# Patient Record
Sex: Female | Born: 1965 | Race: White | Hispanic: No | Marital: Single | Smoking: Never smoker
Health system: Southern US, Community
[De-identification: ages and names within clinical notes are randomized; demographics above are authoritative.]

## PROBLEM LIST (undated history)

## (undated) DIAGNOSIS — N926 Irregular menstruation, unspecified: Secondary | ICD-10-CM

## (undated) DIAGNOSIS — R896 Abnormal cytological findings in specimens from other organs, systems and tissues: Secondary | ICD-10-CM

## (undated) DIAGNOSIS — R109 Unspecified abdominal pain: Secondary | ICD-10-CM

## (undated) DIAGNOSIS — R51 Headache: Secondary | ICD-10-CM

## (undated) DIAGNOSIS — K219 Gastro-esophageal reflux disease without esophagitis: Secondary | ICD-10-CM

## (undated) DIAGNOSIS — R85618 Other abnormal cytological findings on specimens from anus: Secondary | ICD-10-CM

## (undated) DIAGNOSIS — R3129 Other microscopic hematuria: Secondary | ICD-10-CM

## (undated) DIAGNOSIS — R110 Nausea: Secondary | ICD-10-CM

## (undated) DIAGNOSIS — R1011 Right upper quadrant pain: Secondary | ICD-10-CM

## (undated) DIAGNOSIS — N939 Abnormal uterine and vaginal bleeding, unspecified: Secondary | ICD-10-CM

## (undated) HISTORY — DX: Gastro-esophageal reflux disease without esophagitis: K21.9

## (undated) HISTORY — DX: Unspecified abdominal pain: R10.9

## (undated) HISTORY — DX: Irregular menstruation, unspecified: N92.6

## (undated) HISTORY — DX: Abnormal uterine and vaginal bleeding, unspecified: N93.9

## (undated) HISTORY — DX: Right upper quadrant pain: R10.11

## (undated) HISTORY — DX: Nausea: R11.0

## (undated) HISTORY — DX: Other abnormal cytological findings on specimens from anus: R85.618

## (undated) HISTORY — PX: TONSILLECTOMY: SUR1361

## (undated) HISTORY — DX: Other microscopic hematuria: R31.29

## (undated) HISTORY — DX: Headache: R51

## (undated) HISTORY — DX: Abnormal cytological findings in specimens from other organs, systems and tissues: R89.6

---

## 1998-02-26 ENCOUNTER — Other Ambulatory Visit: Admission: RE | Admit: 1998-02-26 | Discharge: 1998-02-26 | Payer: Self-pay | Admitting: Obstetrics and Gynecology

## 2001-06-30 ENCOUNTER — Other Ambulatory Visit: Admission: RE | Admit: 2001-06-30 | Discharge: 2001-06-30 | Payer: Self-pay | Admitting: Obstetrics and Gynecology

## 2002-01-10 ENCOUNTER — Inpatient Hospital Stay (HOSPITAL_COMMUNITY): Admission: AD | Admit: 2002-01-10 | Discharge: 2002-01-12 | Payer: Self-pay | Admitting: Obstetrics and Gynecology

## 2002-06-30 ENCOUNTER — Other Ambulatory Visit: Admission: RE | Admit: 2002-06-30 | Discharge: 2002-06-30 | Payer: Self-pay | Admitting: Obstetrics and Gynecology

## 2003-05-03 ENCOUNTER — Other Ambulatory Visit: Admission: RE | Admit: 2003-05-03 | Discharge: 2003-05-03 | Payer: Self-pay | Admitting: Obstetrics and Gynecology

## 2004-05-14 ENCOUNTER — Other Ambulatory Visit: Admission: RE | Admit: 2004-05-14 | Discharge: 2004-05-14 | Payer: Self-pay | Admitting: Obstetrics and Gynecology

## 2005-06-10 ENCOUNTER — Other Ambulatory Visit: Admission: RE | Admit: 2005-06-10 | Discharge: 2005-06-10 | Payer: Self-pay | Admitting: Obstetrics and Gynecology

## 2007-09-17 ENCOUNTER — Emergency Department (HOSPITAL_COMMUNITY): Admission: EM | Admit: 2007-09-17 | Discharge: 2007-09-17 | Payer: Self-pay | Admitting: Family Medicine

## 2008-07-26 ENCOUNTER — Emergency Department (HOSPITAL_COMMUNITY): Admission: EM | Admit: 2008-07-26 | Discharge: 2008-07-26 | Payer: Self-pay | Admitting: Emergency Medicine

## 2009-02-08 ENCOUNTER — Encounter: Admission: RE | Admit: 2009-02-08 | Discharge: 2009-02-08 | Payer: Self-pay | Admitting: Obstetrics and Gynecology

## 2010-02-21 ENCOUNTER — Ambulatory Visit: Payer: Self-pay | Admitting: Internal Medicine

## 2010-02-21 DIAGNOSIS — R109 Unspecified abdominal pain: Secondary | ICD-10-CM | POA: Insufficient documentation

## 2010-02-21 DIAGNOSIS — R896 Abnormal cytological findings in specimens from other organs, systems and tissues: Secondary | ICD-10-CM

## 2010-02-21 DIAGNOSIS — N939 Abnormal uterine and vaginal bleeding, unspecified: Secondary | ICD-10-CM

## 2010-02-21 DIAGNOSIS — N926 Irregular menstruation, unspecified: Secondary | ICD-10-CM | POA: Insufficient documentation

## 2010-02-21 LAB — CONVERTED CEMR LAB
ALT: 13 units/L (ref 0–35)
Albumin: 4 g/dL (ref 3.5–5.2)
Amylase: 50 units/L (ref 27–131)
BUN: 12 mg/dL (ref 6–23)
Basophils Relative: 0.1 % (ref 0.0–3.0)
CO2: 27 meq/L (ref 19–32)
Chloride: 106 meq/L (ref 96–112)
Creatinine, Ser: 0.7 mg/dL (ref 0.4–1.2)
Eosinophils Absolute: 0 10*3/uL (ref 0.0–0.7)
Eosinophils Relative: 0.5 % (ref 0.0–5.0)
Glucose, Bld: 82 mg/dL (ref 70–99)
Lipase: 32 units/L (ref 11.0–59.0)
Lymphocytes Relative: 14.2 % (ref 12.0–46.0)
Neutrophils Relative %: 79.2 % — ABNORMAL HIGH (ref 43.0–77.0)
Nitrite: NEGATIVE
Platelets: 245 10*3/uL (ref 150.0–400.0)
RBC: 4.17 M/uL (ref 3.87–5.11)
Total Protein: 6.7 g/dL (ref 6.0–8.3)
Urobilinogen, UA: 0.2
WBC: 8.4 10*3/uL (ref 4.5–10.5)

## 2010-02-27 ENCOUNTER — Telehealth: Payer: Self-pay | Admitting: Internal Medicine

## 2010-03-07 ENCOUNTER — Ambulatory Visit: Payer: Self-pay | Admitting: Internal Medicine

## 2010-03-07 DIAGNOSIS — R3129 Other microscopic hematuria: Secondary | ICD-10-CM

## 2010-03-14 ENCOUNTER — Telehealth: Payer: Self-pay

## 2010-03-14 ENCOUNTER — Ambulatory Visit
Admission: RE | Admit: 2010-03-14 | Discharge: 2010-03-14 | Payer: Self-pay | Source: Home / Self Care | Attending: Internal Medicine | Admitting: Internal Medicine

## 2010-03-14 ENCOUNTER — Encounter: Payer: Self-pay | Admitting: Internal Medicine

## 2010-03-14 LAB — CONVERTED CEMR LAB
Bacteria, UA: NONE SEEN
Blood in Urine, dipstick: NEGATIVE
Casts: NONE SEEN /lpf
Glucose, Urine, Semiquant: NEGATIVE
Ketones, urine, test strip: NEGATIVE
Specific Gravity, Urine: 1.01
WBC Urine, dipstick: NEGATIVE
pH: 7

## 2010-03-24 ENCOUNTER — Telehealth: Payer: Self-pay | Admitting: Internal Medicine

## 2010-03-25 ENCOUNTER — Encounter
Admission: RE | Admit: 2010-03-25 | Discharge: 2010-03-25 | Payer: Self-pay | Source: Home / Self Care | Attending: Obstetrics and Gynecology | Admitting: Obstetrics and Gynecology

## 2010-04-10 NOTE — Progress Notes (Signed)
----   Converted from flag ---- ---- 03/14/2010 4:58 PM, Edwyna Perfect MD wrote: pls notify no further blood in urine ------------------------------  Phone Note Outgoing Call   Call placed by: Kyung Rudd, CMA,  March 14, 2010 5:07 PM Call placed to: Patient Details for Reason: results Summary of Call: pt aware Initial call taken by: Kyung Rudd, CMA,  March 14, 2010 5:07 PM

## 2010-04-10 NOTE — Progress Notes (Signed)
Summary: lab results  ---- Converted from flag ---- ---- 02/24/2010 4:20 PM, Edwyna Perfect MD wrote: labs nl but missing ua and urine hcg ------------------------------  pt aware

## 2010-04-10 NOTE — Assessment & Plan Note (Signed)
Summary: TO BE EST/REQ CPX/PT WILL COME IN FASTING/NJR   Vital Signs:  Patient profile:   45 year old female Height:      63.25 inches Weight:      121 pounds BMI:     21.34 O2 Sat:      96 % on Room air Temp:     98.3 degrees F oral Pulse rate:   82 / minute BP sitting:   120 / 80  (left arm) Cuff size:   regular  Vitals Entered By: Romualdo Bolk, CMA (AAMA) (February 21, 2010 8:22 AM)  O2 Flow:  Room air CC: New Pt to get establish- Pt is fasting for labs   CC:  New Pt to get establish- Pt is fasting for labs.  History of Present Illness: Pt presents to clinic for establishment of primary medical care and evaluation of abdominal discomfort.  Notes recent onset of intermittent mild epigastric/periumbilical abd pain. +mild associated nausea without emesis. No radiation of pain. No alleviating or exacerbating factors.  Did have previous episode of brief right flank pain that is sometimes reproducible with palpation. Denies urinary symptoms such as frequency, burning or hematuria. No known h/o kidney stones.  Also c/o recent episode of pelvic pain with now irregular menstration. Currently being followed by gyn for abn pap with pending appt next week.    Preventive Screening-Counseling & Management  Alcohol-Tobacco     Smoking Status: never     Smoking Cessation Counseling: no     Tobacco Counseling: not indicated; no tobacco use      Drug Use:  no.    Current Medications (verified): 1)  Ortho Tri-Cyclen (28) 0.18/0.215/0.25 Mg-35 Mcg Tabs (Norgestim-Eth Estrad Triphasic) .Marland Kitchen.. 1 By Mouth Once Daily 2)  Herbs  Allergies (verified): No Known Drug Allergies  Social History: Married Alcohol use-no Drug use-no Smoking Status:  never Drug Use:  no  Review of Systems      See HPI  Physical Exam  General:  Well-developed,well-nourished,in no acute distress; alert,appropriate and cooperative throughout examination Head:  Normocephalic and atraumatic without obvious  abnormalities. No apparent alopecia or balding. Eyes:  pupils equal, pupils round, pupils reactive to light, corneas and lenses clear, and no injection.   Ears:  no external deformities.   Nose:  no external deformity.   Mouth:  Oral mucosa and oropharynx without lesions or exudates.  Teeth in good repair. Neck:  No deformities, masses, or tenderness noted. Lungs:  Normal respiratory effort, chest expands symmetrically. Lungs are clear to auscultation, no crackles or wheezes. Heart:  Normal rate and regular rhythm. S1 and S2 normal without gallop, murmur, click, rub or other extra sounds. Abdomen:  soft, normal bowel sounds, no distention, no masses, no guarding, no rigidity, no rebound tenderness, no hepatomegaly, and no splenomegaly.  +mild tenderness epigastric area and periumbilical   Impression & Recommendations:  Problem # 1:  ABDOMINAL PAIN OTHER SPECIFIED SITE (ICD-789.09) Assessment New Suspect gastric etiology. Begin empiric PPI tx. Obtain labs. Schedule close followup for re-evaluation. Consider GI if symptoms refractory. Orders: TLB-BMP (Basic Metabolic Panel-BMET) (80048-METABOL) TLB-Hepatic/Liver Function Pnl (80076-HEPATIC) TLB-CBC Platelet - w/Differential (85025-CBCD) TLB-Amylase (82150-AMYL) TLB-Lipase (83690-LIPASE) TLB-Udip w/ Micro (81001-URINE) Specimen Handling (81191) Venipuncture (47829)  Problem # 2:  UNS D/O MENSTRUATION&OTH ABN BLEED FE GNT TRACT (ICD-626.9) Assessment: New Obtain urine hcg and CBC. Keep f/u appt with gyn next week to further address symptoms.  Complete Medication List: 1)  Ortho Tri-cyclen (28) 0.18/0.215/0.25 Mg-35 Mcg Tabs (Norgestim-eth estrad triphasic) .Marland KitchenMarland KitchenMarland Kitchen  1 by mouth once daily 2)  Herbs  3)  Omeprazole 40 Mg Cpdr (Omeprazole) .... One by mouth qd  Other Orders: Urine Pregnancy Test  ([address])  Patient Instructions: 1)  Please schedule a follow-up appointment in 2-3 weeks. Prescriptions: OMEPRAZOLE 40 MG CPDR (OMEPRAZOLE) one  by mouth qd  #30 x 6   Entered and Authorized by:   Edwyna Perfect MD   Signed by:   Edwyna Perfect MD on 02/21/2010   Method used:   Electronically to        [facility]  [phone]* (retail)       [address]       [place], [place]  [address]       Ph: [phone] or [phone]       Fax: [phone]   RxID:   [id number]    Orders Added: 1)  TLB-BMP (Basic Metabolic Panel-BMET) [80048-METABOL] 2)  TLB-Hepatic/Liver Function Pnl [80076-HEPATIC] 3)  TLB-CBC Platelet - w/Differential [85025-CBCD] 4)  TLB-Amylase [82150-AMYL] 5)  TLB-Lipase [83690-LIPASE] 6)  TLB-Udip w/ Micro [81001-URINE] 7)  Urine Pregnancy Test  [81025] 8)  Specimen Handling [99000] 9)  Venipuncture [03474] 10)  New Patient Level II [99202]    Laboratory Results   Urine Tests    Routine Urinalysis   Color: yellow Appearance: Clear Glucose: negative   (Normal Range: Negative) Bilirubin: negative   (Normal Range: Negative) Ketone: 2+   (Normal Range: Negative) Spec. Gravity: 1.020   (Normal Range: 1.003-1.035) Blood: 1+   (Normal Range: Negative) Protein: 1+   (Normal Range: Negative) Urobilinogen: 0.2   (Normal Range: 0-1) Nitrite: negative   (Normal Range: Negative) Leukocyte Esterace: negative   (Normal Range: Negative)    Urine HCG: negative Comments: Rita Ohara  February 21, 2010 3:11 PM

## 2010-04-10 NOTE — Assessment & Plan Note (Signed)
Summary: 2-3 WK ROV//SLM   Vital Signs:  Patient profile:   45 year old female Weight:      125 pounds BP sitting:   110 / 80  (left arm)  Vitals Entered By: Kyung Rudd, CMA (March 07, 2010 8:58 AM)  History of Present Illness: Patient presents to clinic for followup of multiple medical problems. Recently evaluated for abd pain and nausea.Notes discomfort typically epigastric area. Began daily PPI and may note improvement over last several days. Labs reviewed nl with exception of blood on UA. No gross hematuria. Previous single episode of flank pain with no further episodes. Wt reviewed stable. States is being evaluated by gyn for pelvic pain.  Current Medications (verified): 1)  Ortho Tri-Cyclen (28) 0.18/0.215/0.25 Mg-35 Mcg Tabs (Norgestim-Eth Estrad Triphasic) .Marland Kitchen.. 1 By Mouth Once Daily 2)  Herbs 3)  Omeprazole 40 Mg Cpdr (Omeprazole) .... One By Mouth Qd  Allergies (verified): No Known Drug Allergies  Social History: Reviewed history from 02/21/2010 and no changes required. Married Alcohol use-no Drug use-no  Review of Systems GI:  Complains of abdominal pain; denies bloody stools, dark tarry stools, loss of appetite, nausea, and vomiting. GU:  Denies hematuria.  Physical Exam  General:  Well-developed,well-nourished,in no acute distress; alert,appropriate and cooperative throughout examination   Impression & Recommendations:  Problem # 1:  ABDOMINAL PAIN OTHER SPECIFIED SITE (ICD-789.09) Assessment Improved Recent improvement with PPI tx. Continue PPI. If no resolution of symptoms within 3-4 wks then proceed with GI consult  Problem # 2:  MICROSCOPIC HEMATURIA (ICD-599.72) Assessment: New Repeat UA  Complete Medication List: 1)  Ortho Tri-cyclen (28) 0.18/0.215/0.25 Mg-35 Mcg Tabs (Norgestim-eth estrad triphasic) .Marland Kitchen.. 1 by mouth once daily 2)  Herbs  3)  Omeprazole 40 Mg Cpdr (Omeprazole) .... One by mouth qd  Patient Instructions: 1)  Please  schedule a follow-up appointment in 3 months. 2)  Please obtain urinalysis with micro in one week dx:599.72

## 2010-04-10 NOTE — Progress Notes (Signed)
  Phone Note Outgoing Call   Call placed by: Kyung Rudd, CMA,  March 24, 2010 1:38 PM Call placed to: Patient Summary of Call: discussed with Dr. Rodena Medin about seeing a GI if sx didn't improve with meds. Pt notes that she is not feeling any better and would like referral. Initial call taken by: Kyung Rudd, CMA,  March 24, 2010 1:40 PM  Follow-up for Phone Call        referral placed Follow-up by: Edwyna Perfect MD,  March 24, 2010 1:47 PM  Additional Follow-up for Phone Call Additional follow up Details #1::        pt aware Additional Follow-up by: Kyung Rudd, CMA,  March 24, 2010 2:11 PM

## 2010-04-14 ENCOUNTER — Encounter: Payer: Self-pay | Admitting: Internal Medicine

## 2010-04-25 ENCOUNTER — Other Ambulatory Visit: Payer: Self-pay | Admitting: Gastroenterology

## 2010-04-25 ENCOUNTER — Encounter: Payer: Self-pay | Admitting: Gastroenterology

## 2010-04-25 ENCOUNTER — Telehealth: Payer: Self-pay | Admitting: Gastroenterology

## 2010-04-25 ENCOUNTER — Encounter (INDEPENDENT_AMBULATORY_CARE_PROVIDER_SITE_OTHER): Payer: Self-pay | Admitting: *Deleted

## 2010-04-25 ENCOUNTER — Ambulatory Visit (INDEPENDENT_AMBULATORY_CARE_PROVIDER_SITE_OTHER): Payer: Self-pay | Admitting: Gastroenterology

## 2010-04-25 DIAGNOSIS — R1011 Right upper quadrant pain: Secondary | ICD-10-CM

## 2010-04-25 DIAGNOSIS — R110 Nausea: Secondary | ICD-10-CM | POA: Insufficient documentation

## 2010-04-25 DIAGNOSIS — K219 Gastro-esophageal reflux disease without esophagitis: Secondary | ICD-10-CM | POA: Insufficient documentation

## 2010-04-25 DIAGNOSIS — K5900 Constipation, unspecified: Secondary | ICD-10-CM

## 2010-04-25 DIAGNOSIS — R51 Headache: Secondary | ICD-10-CM | POA: Insufficient documentation

## 2010-04-25 DIAGNOSIS — R519 Headache, unspecified: Secondary | ICD-10-CM | POA: Insufficient documentation

## 2010-04-25 LAB — IGA: IgA: 213 mg/dL (ref 68–378)

## 2010-04-25 LAB — CONVERTED CEMR LAB: Tissue Transglutaminase Ab, IgA: 2.5 units (ref <20)

## 2010-04-25 LAB — HIGH SENSITIVITY CRP: CRP, High Sensitivity: 2.24 mg/L (ref 0.00–5.00)

## 2010-04-30 NOTE — Progress Notes (Signed)
Summary: Medication  Phone Note Call from Patient   Caller: Patient Call For: Dr. Jarold Motto Reason for Call: Talk to Nurse Summary of Call: Pt. said Dr. Jarold Motto was going to prescribe something for acid reflux this morning. But the pharmacy doesn't have it. Initial call taken by: Karna Christmas,  April 25, 2010 12:49 PM  Follow-up for Phone Call        Dr. Jarold Motto wanted to order Librax , rx sent pt aware Follow-up by: Harlow Mares CMA (AAMA),  April 25, 2010 1:20 PM    New/Updated Medications: LIBRAX 2.5-5 MG CAPS (CLIDINIUM-CHLORDIAZEPOXIDE) take one by mouth three times a day before meals. Prescriptions: LIBRAX 2.5-5 MG CAPS (CLIDINIUM-CHLORDIAZEPOXIDE) take one by mouth three times a day before meals.  #90 x 3   Entered by:   Harlow Mares CMA (AAMA)   Authorized by:   Mardella Layman MD [facility]   Signed by:   Harlow Mares CMA (AAMA) on 04/25/2010   Method used:   Electronically to        [facility]  [phone]* (retail)       [address]       [place], [place]  [address]       Ph: [phone] or [phone]       Fax: [phone]   RxID:   [id number]

## 2010-04-30 NOTE — Assessment & Plan Note (Addendum)
Summary: PERSISTANT ABD PAIN/YF   Lisa Orr/NO GI HX//NO INS. ADVISED TO B...   History of Present Illness Visit Type: Initial Consult Primary GI MD: Sheryn Bison MD FACP FAGA Primary Provider: Charlynn Court, MD  Requesting Provider: Charlynn Court, MD  Chief Complaint: Pt c/o RUQ abd pain, headaches and nausea  History of Present Illness:   Very Pleasant 45 year old Caucasian female referred by Dr. Charlynn Court for evaluation of 4-5 months of diffuse abdominal discomfort, burning substernal chest pain, and refractory constipation.  Deionna had been married approximately one year, and feels that she is" falling apart". She's had lifelong constipation and takes 6 magnesium citrate tablets a day .Over the last 4-5 months she's had pain in her right upper quadrant,epigastric area, periumbilical area, and also burning pain in her chest despite being on omeprazole 40 mg a day for several months. She is dry variety of her herbal substances without improvement. She is on daily birth control pills and denies gynecologic problems. There is no history of alcohol, cigarette, or NSAID abuse.  She denies typical reflux symptoms but does have burning pain mostly in her upper chest area. There no specific cardiopulmonary symptoms, and she denies any hepatobiliary complaints or history of hepatitis or pancreatitis. Her appetite is good her weight is stable. She denies any specific food intolerances, or systemic complaints such as fever or chills, skin rashes or joint pains. She has not had previous GI evaluations. Lab review shows normal CBC and metabolic profile, amylase and lipase.  She does admit to personal stress which may be exacerbating her symptoms She denies dysphagia, clay colored stools, dark urine, icterus, fever or chills.   GI Review of Systems    Reports abdominal pain, acid reflux, bloating, chest pain, heartburn, and  nausea.     Location of  Abdominal pain: RUQ.    Denies belching, dysphagia  with liquids, dysphagia with solids, loss of appetite, vomiting, vomiting blood, weight loss, and  weight gain.      Reports constipation and  rectal bleeding.     Denies anal fissure, black tarry stools, change in bowel habit, diarrhea, diverticulosis, fecal incontinence, heme positive stool, hemorrhoids, irritable bowel syndrome, jaundice, light color stool, liver problems, and  rectal pain.    Current Medications (verified): 1)  Ortho Tri-Cyclen (28) 0.18/0.215/0.25 Mg-35 Mcg Tabs (Norgestim-Eth Estrad Triphasic) .Marland Kitchen.. 1 By Mouth Once Daily 2)  Herbs 3)  Omeprazole 40 Mg Cpdr (Omeprazole) .... One By Mouth Qd 4)  Cal/mag Citrate 250-125 Mg Tabs (Calcium-Magnesium) .... Six Tablets By Mouth Once Daily  Allergies (verified): No Known Drug Allergies  Past History:  Past medical, surgical, family and social histories (including risk factors) reviewed for relevance to current acute and chronic problems.  Past Medical History: RUQ PAIN (ICD-789.01) GERD (ICD-530.81) NAUSEA (ICD-787.02) HEADACHE (ICD-784.0) MICROSCOPIC HEMATURIA (ICD-599.72) UNS D/O MENSTRUATION&OTH ABN BLEED FE GNT TRACT (ICD-626.9) OTHER ABNORMAL PAP SMEAR OF ANUS AND ANAL HPV (ICD-796.79) ABDOMINAL PAIN OTHER SPECIFIED SITE (ICD-789.09)  Past Surgical History: Tonsillectomy  Family History: Reviewed history and no changes required. 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  The patient denies anemia, arthritis/joint pain,  back pain, breast changes/lumps, change in vision, confusion, cough, coughing up blood,  depression-new, fainting, fatigue, fever, hearing problems, heart murmur, heart rhythm changes, itching, menstrual pain, muscle pains/cramps, night sweats, nosebleeds, pregnancy symptoms, shortness of breath, skin rash, sleeping problems, sore throat, swelling of feet/legs, swollen lymph glands, thirst - excessive , urination - excessive , urination changes/pain, urine leakage, vision changes, and voice change.    Vital Signs:  Patient profile:   45 year old female Height:      63.25 inches Weight:      125 pounds BMI:     22.05 BSA:     1.59 Pulse rate:   88 / minute Pulse rhythm:   regular BP sitting:   110 / 76  Vitals Entered By: Ok Anis CMA (April 25, 2010 10:11 AM)  Physical Exam  General:  Well developed, well nourished, no acute distress.healthy appearing.   Head:  Normocephalic and atraumatic. Eyes:  PERRLA, no icterus.exam deferred to patient's ophthalmologist.   Neck:  Supple; no masses or thyromegaly. Lungs:  Clear throughout to auscultation. Heart:  Regular rate and rhythm; no murmurs, rubs,  or bruits. Abdomen:  Soft, nontender and nondistended. No masses, hepatosplenomegaly or hernias noted. Normal bowel sounds.There is some hepatic prominence on exam somewhat firm liver edge. She also has tenderness over a floating rib in the right upper quadrant area. Rectal:  deferred until time of colonoscopy.   Msk:  Symmetrical with no gross deformities. Normal posture. Pulses:  Normal pulses noted. Extremities:  No clubbing, cyanosis, edema or deformities noted. Neurologic:  Alert and  oriented x4;  grossly normal neurologically. Skin:  Intact without significant lesions or rashes. Cervical Nodes:  No significant cervical adenopathy. Psych:  Alert and cooperative. Normal mood and affect.   Impression & Recommendations:  Problem # 1:  CONSTIPATION (ICD-564.00) Assessment Unchanged Colonoscopy scheduled her convenience. She has a family history of ulcerative colitis in  her mother. Screening labs also ordered. 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 Orders: TLB-CRP-High Sensitivity (C-Reactive Protein) (86140-FCRP) TLB-Sedimentation Rate (ESR) (85652-ESR) TLB-IgA (Immunoglobulin A) (82784-IGA) T-Sprue Panel (Celiac Disease Aby Eval) (83516x3/[address]) Colon/Endo (Colon/Endo)  Problem # 2:  RUQ PAIN (ICD-789.01) Assessment: Unchanged Probable musculoskeletal pain, rule out cholelithiasis,structural hepatic lesions, or occult liver disease.She does admit to stress plan a role in her symptomatology, and I prescribed Librax t.i.d. Orders: TLB-CRP-High Sensitivity (C-Reactive Protein) (86140-FCRP) TLB-Sedimentation Rate (ESR) (85652-ESR) TLB-IgA (Immunoglobulin A) (82784-IGA) T-Sprue Panel (Celiac Disease Aby Eval) (83516x3/[address]) Colon/Endo (Colon/Endo) Ultrasound Abdomen (UAS)  Problem # 3:  GERD (ICD-530.81) Assessment: Unchanged Unusual burning chest pain unresponsive to omeprazole therapy. Endoscopy has been scheduled and we will check her for H. pylori, celiac disease, and eosinophilic esophagitis. I asked to continue her omeprazole and I placed her on Librax one p.o. t.i.d. before meals pending further workup. Orders: TLB-CRP-High Sensitivity (C-Reactive Protein) (86140-FCRP) TLB-Sedimentation Rate (ESR) (85652-ESR) TLB-IgA (Immunoglobulin A) (82784-IGA) T-Sprue Panel (Celiac Disease Aby Eval) (83516x3/[address]) Colon/Endo (Colon/Endo)   Patient Instructions: 1)  Copy sent to : Charlynn Court, MD  2)  Your procedure has been scheduled for 05/05/2010, please follow the seperate instructions.  3)  [facility] Patient Information Guide given to patient.  4)  Colonoscopy and Flexible Sigmoidoscopy brochure given.  5)  Upper Endoscopy brochure given.  6)  Your prescription(s) have been sent to you pharmacy.  7)  Please go to the basement today for your labs.  8)  Your abdomainal ultrasound is scheduled for 05/01/2010,  please follow seperate instructions.  9)  The medication list was reviewed and reconciled.  All changed / newly prescribed medications were explained.  A complete medication list was provided to the patient / caregiver. Prescriptions: MOVIPREP 100 GM  SOLR (PEG-KCL-NACL-NASULF-NA ASC-C) As per prep instructions.  #1 x 0   Entered by:   Harlow Mares CMA (AAMA)   Authorized by:   Mardella Layman MD [facility]   Signed by:   Harlow Mares CMA (AAMA) on 04/25/2010   Method used:   Electronically to        [facility]  [phone]* (retail)       [address]       [place], [place]  [address]       Ph: [phone] or [phone]       Fax: [phone]   RxID:   [id number]

## 2010-04-30 NOTE — Letter (Signed)
Summary: [facility] Instructions  [facility]  [address] [place], [place] [address]   Phone: [phone]  Fax: [phone]       Lisa Orr    June 11, 1965    MRN: [id number]        Procedure Day /Date: Monday 05/05/2010     Arrival Time: 1:00pm     Procedure Time: 2:00pm     Location of Procedure:                    [facility] (4th Floor)  PREPARATION FOR COLONOSCOPY WITH MOVIPREP   Starting 5 days prior to your procedure 2/22/2012at nuts, seeds, popcorn, corn, beans, peas,  salads, or any raw vegetables.  Do not take any fiber supplements (e.g. Metamucil, Citrucel, and Benefiber).  THE DAY BEFORE YOUR PROCEDURE         Sunday 05/04/2010  1.  Drink clear liquids the entire day-NO SOLID FOOD  2.  Do not drink anything colored red or purple.  Avoid juices with pulp.  No orange juice.  3.  Drink at least 64 oz. (8 glasses) of fluid/clear liquids during the day to prevent dehydration and help the prep work efficiently.  CLEAR LIQUIDS INCLUDE: Water Jello Ice Popsicles Tea (sugar ok, no milk/cream) Powdered fruit flavored drinks Coffee (sugar ok, no milk/cream) Gatorade Juice: apple, white grape, white cranberry  Lemonade Clear bullion, consomm, broth Carbonated beverages (any kind) Strained chicken noodle soup Hard Candy                             4.  In the morning, mix first dose of MoviPrep solution:    Empty 1 Pouch A and 1 Pouch B into the disposable container    Add lukewarm drinking water to the top line of the container. Mix to dissolve    Refrigerate (mixed solution should be used within 24 hrs)  5.  Begin drinking the prep at 5:00 p.m. The MoviPrep container is divided by 4 marks.   Every 15 minutes drink the solution down to the next mark (approximately 8 oz) until the full liter is complete.   6.  Follow completed prep with 16 oz of clear liquid of your choice (Nothing red or purple).  Continue to drink clear  liquids until bedtime.  7.  Before going to bed, mix second dose of MoviPrep solution:    Empty 1 Pouch A and 1 Pouch B into the disposable container    Add lukewarm drinking water to the top line of the container. Mix to dissolve    Refrigerate  THE DAY OF YOUR PROCEDURE      Monday 05/05/2010  Beginning at 9:00am (hours before procedure):         1. Every 15 minutes, drink the solution down to the next mark (approx 8 oz) until the full liter is complete.  2. Follow completed prep with 16 oz. of clear liquid of your choice.    3. You may drink clear liquids until 12:00pm(2 HOURS BEFORE PROCEDURE).   MEDICATION INSTRUCTIONS  Unless otherwise instructed, you should take regular prescription medications with a small sip of water   as early as possible the morning of your procedure.        OTHER INSTRUCTIONS  You will need a responsible adult at least 44 years of age to accompany you and drive you home.   This person must remain in  the waiting room during your procedure.  Wear loose fitting clothing that is easily removed.  Leave jewelry and other valuables at home.  However, you may wish to bring a book to read or  an iPod/MP3 player to listen to music as you wait for your procedure to start.  Remove all body piercing jewelry and leave at home.  Total time from sign-in until discharge is approximately 2-3 hours.  You should go home directly after your procedure and rest.  You can resume normal activities the  day after your procedure.  The day of your procedure you should not:   Drive   Make legal decisions   Operate machinery   Drink alcohol   Return to work  You will receive specific instructions about eating, activities and medications before you leave.    The above instructions have been reviewed and explained to me by   _______________________    I fully understand and can verbalize these instructions _____________________________ Date _________

## 2010-05-01 ENCOUNTER — Encounter (INDEPENDENT_AMBULATORY_CARE_PROVIDER_SITE_OTHER): Payer: Self-pay | Admitting: *Deleted

## 2010-05-01 ENCOUNTER — Ambulatory Visit (HOSPITAL_COMMUNITY)
Admission: RE | Admit: 2010-05-01 | Discharge: 2010-05-01 | Disposition: A | Discharge status: Home / Self Care | Payer: Self-pay | Source: Ambulatory Visit | Attending: Gastroenterology | Admitting: Gastroenterology

## 2010-05-01 ENCOUNTER — Encounter: Payer: Self-pay | Admitting: Gastroenterology

## 2010-05-01 DIAGNOSIS — R1011 Right upper quadrant pain: Secondary | ICD-10-CM | POA: Insufficient documentation

## 2010-05-02 ENCOUNTER — Telehealth: Payer: Self-pay | Admitting: Gastroenterology

## 2010-05-05 ENCOUNTER — Encounter (AMBULATORY_SURGERY_CENTER): Payer: Self-pay | Admitting: Gastroenterology

## 2010-05-05 ENCOUNTER — Other Ambulatory Visit: Payer: Self-pay | Admitting: Gastroenterology

## 2010-05-05 ENCOUNTER — Encounter: Payer: Self-pay | Admitting: Gastroenterology

## 2010-05-05 DIAGNOSIS — R109 Unspecified abdominal pain: Secondary | ICD-10-CM

## 2010-05-05 DIAGNOSIS — K5900 Constipation, unspecified: Secondary | ICD-10-CM

## 2010-05-05 DIAGNOSIS — R1011 Right upper quadrant pain: Secondary | ICD-10-CM

## 2010-05-06 LAB — HELICOBACTER PYLORI SCREEN-BIOPSY: UREASE: NEGATIVE

## 2010-05-06 NOTE — Progress Notes (Signed)
Summary: Ultrasound results  Phone Note Call from Patient Call back at Home Phone [phone]   Caller: Patient Call For: Dr. Jarold Motto Reason for Call: Lab or Test Results Summary of Call: Calling about Abd Ultrasound results Initial call taken by: Karna Christmas,  May 02, 2010 1:54 PM  Follow-up for Phone Call        abdominal ultrasound is normal. she is given the number to pt assistance [phone].  Follow-up by: Harlow Mares CMA Duncan Dull),  May 02, 2010 3:56 PM

## 2010-05-13 ENCOUNTER — Encounter: Payer: Self-pay | Admitting: Gastroenterology

## 2010-05-15 NOTE — Miscellaneous (Signed)
Summary: Abdominal U/S  US Abdomen Complete - STATUS: Final  IMAGE                                     Perform Date: 23Feb12 09:48  Ordered By: Talbert Forest Date: [id number] 08:48  Facility: [facility]                              Department: [place]  Service Report Text  [facility] Accession Number: [id number]     Clinical Data:  45 year old female with abdominal pain.    COMPLETE ABDOMINAL ULTRASOUND    Comparison:  None.    Findings:    Gallbladder:  No gallstones, gallbladder wall thickening, or   pericholecystic fluid.  No sonographic Murphy's sign elicited.    Common bile duct:  Normal measuring 1-2 mm in diameter.    Liver:  No focal lesion identified.  Within normal limits in   parenchymal echogenicity.    IVC:  Appears normal.    Pancreas:  No focal abnormality seen.    Spleen:  Normal measuring 4.9 cm in length.    Right Kidney:  Normal measuring 10.9 cm in length.    Left Kidney:  Normal measuring 10.4 cm in length.    Abdominal aorta:  No aneurysm identified.    IMPRESSION:   Negative abdominal ultrasound.    Original Report Authenticated By: Harley Hallmark, M.D.  Additional Information  HL7 RESULT STATUS : F  External image : [id number]  External IF Update Timestamp : 2010-05-01:09:48:00.000000 Clinical Lists Changes

## 2010-05-15 NOTE — Miscellaneous (Signed)
Summary: clotest  Clinical Lists Changes  Orders: Added new Test order of TLB-H Pylori Screen Gastric Biopsy (83013-CLOTEST) - Signed 

## 2010-05-15 NOTE — Procedures (Signed)
Summary: Colonoscopy  Patient: Romana Deaton Note: All result statuses are Final unless otherwise noted.  Tests: (1) Colonoscopy (COL)   COL Colonoscopy           DONE     [facility]     [address]     [place], [place]  [address]           COLONOSCOPY PROCEDURE REPORT           PATIENT:  Rielly, Brunn  MR#:  [id number]     BIRTHDATE:  Apr 21, 1965, 44 yrs. old  GENDER:  female     ENDOSCOPIST:  Vania Rea. Jarold Motto, MD, [facility]     REF. BY:  Charlynn Court, M.D.     PROCEDURE DATE:  05/05/2010     PROCEDURE:  Average-risk screening colonoscopy     G0121     ASA CLASS:  Class I     INDICATIONS:  constipation, Abdominal pain     MEDICATIONS:   Fentanyl 75 mcg IV, Versed 8 mg IV           DESCRIPTION OF PROCEDURE:   After the risks benefits and     alternatives of the procedure were thoroughly explained, informed     consent was obtained.  Digital rectal exam was performed and     revealed no abnormalities.   The [id number] endoscope was     introduced through the anus and advanced to the cecum, which was     identified by both the appendix and ileocecal valve, without     limitations.  The quality of the prep was excellent, using     MoviPrep.  The instrument was then slowly withdrawn as the colon     was fully examined.     <<PROCEDUREIMAGES>>           FINDINGS:  No polyps or cancers were seen.  This was otherwise a     normal examination of the colon.   Retroflexed views in the rectum     revealed no abnormalities.    The scope was then withdrawn from     the patient and the procedure completed.           COMPLICATIONS:  None     ENDOSCOPIC IMPRESSION:     1) No polyps or cancers     2) Otherwise normal examination     CONSTIPATION PREDOMINANT IBS.     RECOMMENDATIONS:     1) high fiber diet     2) metamucil or benefiber     3) Upper endoscopy will be scheduled     REPEAT EXAM:  No           ______________________________     Vania Rea. Jarold Motto, MD,  Clementeen Graham           CC:           n.     eSIGNED:   Vania Rea. Mikias Lanz at 05/05/2010 03:40 PM           Gordy Councilman, [id number]  Note: An exclamation mark (!) indicates a result that was not dispersed into the flowsheet. Document Creation Date: 05/05/2010 3:41 PM _______________________________________________________________________  (1) Order result status: Final Collection or observation date-time: 05/05/2010 15:29 Requested date-time:  Receipt date-time:  Reported date-time:  Referring Physician:   Ordering Physician: Sheryn Bison [id number]) Specimen Source:  Source: Launa Grill Order Number: [id number] Lab site:

## 2010-05-15 NOTE — Procedures (Addendum)
Summary: Upper Endoscopy  Patient: Lisa Orr Note: All result statuses are Final unless otherwise noted.  Tests: (1) Upper Endoscopy (EGD)   EGD Upper Endoscopy       DONE     [facility]     [address]     [place], [place]  [address]          ENDOSCOPY PROCEDURE REPORT          PATIENT:  [name], [name]  MR#:  [id number]     BIRTHDATE:  1965-03-13, 44 yrs. old  GENDER:  female          ENDOSCOPIST:  Vania Rea. Jarold Motto, MD, [facility]     Referred by:  Rollene Rotunda, M.D.          PROCEDURE DATE:  05/05/2010     PROCEDURE:  EGD with biopsy, 43239, EGD with biopsy for H. pylori     43239     ASA CLASS:  Class I     INDICATIONS:  RUQ PAIN          MEDICATIONS:   There was residual sedation effect present from     prior procedure., Fentanyl 25 mcg IV, Versed 2 mg IV     TOPICAL ANESTHETIC:  Exactacain Spray          DESCRIPTION OF PROCEDURE:   After the risks benefits and     alternatives of the procedure were thoroughly explained, informed     consent was obtained.  The [facility] GIF-H180 [id number] endoscope was     introduced through the mouth and advanced to the second portion of     the duodenum, without limitations.  The instrument was slowly     withdrawn as the mucosa was fully examined.     <<PROCEDUREIMAGES>>          The upper, middle, and distal third of the esophagus were     carefully inspected and no abnormalities were noted. The z-line     was well seen at the GEJ. The endoscope was pushed into the fundus     which was normal including a retroflexed view. The antrum,gastric     body, first and second part of the duodenum were unremarkable. SI     BIOPSY AND CLO BX. DONE.    Retroflexed views revealed no     abnormalities.    The scope was then withdrawn from the patient     and the procedure completed.          COMPLICATIONS:  None          ENDOSCOPIC IMPRESSION:     1) Normal EGD     R/O CELIAC DISEASE,H.PYLORI,VS IBS.     RECOMMENDATIONS:     1) Await pathology results     CONTINUE LIBRAX TID.RX CONSTIPATION,,,          REPEAT EXAM:  No          ______________________________     Vania Rea. Jarold Motto, MD, [facility]          CC:          n.     eSIGNED:   Vania Rea. Sebastien Jackson at 05/05/2010 03:48 PM          Gordy Councilman, [id number]  Note: An exclamation mark (!) indicates a result that was not dispersed into the flowsheet. Document Creation Date: 05/05/2010 3:48 PM _______________________________________________________________________  (1) Order result status: Final Collection or observation date-time: 05/05/2010 15:38 Requested date-time:  Receipt date-time:  Reported date-time:  Referring Physician:   Ordering Physician: Sheryn Bison [phone]) Specimen Source:  Source: Launa Grill Order Number: [id number] Lab site:

## 2010-05-19 ENCOUNTER — Telehealth: Payer: Self-pay | Admitting: Gastroenterology

## 2010-05-20 NOTE — Letter (Signed)
Summary: Patient [facility] Biopsy Results   Gastroenterology  [address] [place], [place] [address]   Phone: [phone]  Fax: [phone]        May 13, 2010 MRN: [id number]    [facility] [address] [place], [place]  [address]    Dear Ms. EDWARDS,  I am pleased to inform you that the biopsies taken during your recent endoscopic examination did not show any evidence of cancer upon pathologic examination.Biopsies were negative for H. pylori infection, and there was no evidence of celiac disease.  Additional information/recommendations:  __No further action is needed at this time.  Please follow-up with      your primary care physician for your other healthcare needs.  __ Please call [phone] to schedule a return visit to review      your condition.  _x_ Continue with the treatment plan as outlined on the day of your      exam.  __ You should have a repeat endoscopic examination for this problem              in _ months/years.   Please call us if you are having persistent problems or have questions about your condition that have not been fully answered at this time.  Sincerely,  Mardella Layman MD [facility]  This letter has been electronically signed by your physician.  Appended Document: Patient Notice-Endo Biopsy Results letter mailed

## 2010-05-27 ENCOUNTER — Ambulatory Visit: Payer: Self-pay | Admitting: Gastroenterology

## 2010-05-27 NOTE — Progress Notes (Signed)
Summary: Fu appt?  Phone Note Call from Patient Call back at Home Phone [phone]   Call For: Dr Jarold Motto Summary of Call: Posey Rea if she needs to make a  follow up appt to see Dr Jarold Motto or not. Initial call taken by: Leanor Kail [facility],  May 19, 2010 11:44 AM  Follow-up for Phone Call        pt aware she already has appt on 05/27/2010 Follow-up by: Harlow Mares CMA (AAMA),  May 19, 2010 1:01 PM

## 2010-05-30 ENCOUNTER — Telehealth: Payer: Self-pay | Admitting: Gastroenterology

## 2010-06-06 ENCOUNTER — Ambulatory Visit: Payer: Self-pay | Admitting: Internal Medicine

## 2010-07-02 ENCOUNTER — Encounter: Payer: Self-pay | Admitting: *Deleted

## 2010-07-02 ENCOUNTER — Telehealth: Payer: Self-pay | Admitting: Gastroenterology

## 2010-07-02 NOTE — Telephone Encounter (Signed)
THIS A PROBLEM FOR ADMNISTRATION...Marland Kitchen

## 2010-07-02 NOTE — Telephone Encounter (Signed)
Informed pt Dr Jarold Motto referred this to Administration. Informed her I will give this to my Director and see what she suggests; pt stated understanding.

## 2010-07-02 NOTE — Telephone Encounter (Signed)
Pt called in to question her bill. She was under the impression a NP visit was $184, but she was billed $365. Her lab fees, $489. She was billed twice it appears for the [facility]- $603 and $787 for the Colon and $668 and $1750. I asked Dwan Bolt and she stated the [facility] charges were correct and the $184 was the co pay because the MD can charge as high as $365. Pt is out of work and has lost her insurance and wondered if these fees can be reduced.  Pt is no better and will f/u with Dr Jarold Motto on 07/18/10 at 0845.

## 2010-07-07 ENCOUNTER — Encounter: Payer: Self-pay | Admitting: Internal Medicine

## 2010-07-07 ENCOUNTER — Ambulatory Visit (INDEPENDENT_AMBULATORY_CARE_PROVIDER_SITE_OTHER): Payer: Self-pay | Admitting: Internal Medicine

## 2010-07-07 DIAGNOSIS — F411 Generalized anxiety disorder: Secondary | ICD-10-CM

## 2010-07-07 DIAGNOSIS — R109 Unspecified abdominal pain: Secondary | ICD-10-CM

## 2010-07-07 DIAGNOSIS — F419 Anxiety disorder, unspecified: Secondary | ICD-10-CM | POA: Insufficient documentation

## 2010-07-07 NOTE — Assessment & Plan Note (Signed)
Negative workup to date. Results reviewed in detail. Reassured.

## 2010-07-07 NOTE — Assessment & Plan Note (Signed)
Begin regular exercise program. Discussed possible low-dose SSRI and/or outpatient counseling. Patient states will consider and contact clinic

## 2010-07-07 NOTE — Progress Notes (Signed)
  Subjective:    Patient ID: Lisa Orr, female    DOB: Apr 23, 1965, 45 y.o.   MRN: [id number]  HPI Pt presents to clinic for evaluation of abdominal pain. Has had several month history of abdominal pain associated constipation. Reviewed normal CBC Chem-7 liver function tests amylase and lipase. Now status post GI consult with normal EGD, colonoscopy and abdominal ultrasound. Biopsies negative. Symptoms not improved with two months of PPI. Patient points out has had increased stress in life since marriage one year ago. Wonders if symptoms may be provoked by stress. States has undergone counseling in the past but not recently. Denies depressive symptoms.  Reviewed past medical history, medications and allergies.    Review of Systems  Gastrointestinal: Positive for abdominal pain and constipation. Negative for nausea and diarrhea.  Psychiatric/Behavioral: The patient is nervous/anxious.        Objective:   Physical Exam  [nursing notereviewed. Constitutional: She appears well-developed and well-nourished. No distress.  HENT:  Head: Normocephalic and atraumatic.  Neurological: She is alert.  Skin: Skin is warm and dry. She is not diaphoretic.  Psychiatric: She has a normal mood and affect.          Assessment & Plan:

## 2010-07-18 ENCOUNTER — Ambulatory Visit: Payer: Self-pay | Admitting: Gastroenterology

## 2010-07-25 NOTE — H&P (Signed)
NAME:  Lisa Orr, Lisa Orr NO.:  [id number]   MEDICAL RECORD NO.:  [id number]                   PATIENT TYPE:  INP   LOCATION:  9119                                 FACILITY:  [facility]   PHYSICIAN:  Crist Fat. Rivard, M.D.              DATE OF BIRTH:  1965-06-08   DATE OF ADMISSION:  01/10/2002  DATE OF DISCHARGE:                                HISTORY & PHYSICAL   HISTORY OF PRESENT ILLNESS:  The patient is a 45 year old single white  female gravida 4 para 1-0-2-1 at 38-weeks gestation who presents complaining  of leaking clear fluid at approximately 3:15 a.m. with the subsequent onset  of uterine contractions every 2 minutes that were strong.  She denies any  nausea, vomiting, headaches, or visual disturbances.  Her pregnancy has been  followed at [facility] by the Certified Nurse Midwife Service  and has been essentially uncomplicated though at risk for (1) history of a  LEEP, (2) history of two ABs, (3) history of rapid labor, (4) history of  questionable IUGR, and (5) hypertension in the third trimester with her  previous pregnancy.  Her group B strep is negative.   OBSTETRICS-GYNECOLOGIC HISTORY:  She is a gravida 4 para 1-0-2-1 who had  elective ABs in 45 and 1995.  She delivered a viable female infant in July  1997 who weighed 4 pounds 9 ounces at [redacted] weeks gestation following a 3-hour  labor.  She delivered vaginally with no anesthesia.  That infant's name was  Serita Kyle and she did have elevated blood pressures towards the end of that  pregnancy and preterm labor with that pregnancy also.  Other GYN history she  has had a LEEP in 1995 and in 1999 and has been told that her cervix was  shortened.   GENERAL MEDICAL HISTORY:  She is allergic to SUDAFED - it gives her  increased heart rate.  She reports having had the usual childhood diseases.  She has a history of migraines occasionally and her only surgery was  tonsillectomy,  adenoidectomy, D&C, and LEEP.   FAMILY HISTORY:  Father with MI.  Paternal uncle with bypass surgery.  Paternal aunt with irregular heart rate.  Maternal grandfather with  hypertension.  Paternal grandfather with emphysema.  Maternal uncle with  type 2 diabetes.  Father with basal cell carcinoma.  Paternal aunt with  breast cancer.   GENETIC HISTORY:  Third cousin with a heart murmur and Down syndrome and  mental retardation.   SOCIAL HISTORY:  She is single but the father of the baby is Drema Balzarine.  He is involved and supportive.  He is employed as a Social worker.  She is a  Scientist, water quality.  They are of the [place] faith.  They deny any illicit drug  use, alcohol, or smoking with this pregnancy.   PRENATAL LABORATORIES:  Her blood type is A positive,  antibody screen is  negative, syphilis is nonreactive, rubella is immune, hepatitis B surface  antigen is negative, cystic fibrosis was negative and GC and Chlamydia were  both negative, Pap smear was within normal limits, her 1-hour glucola was 85  and a maternal serum alpha-fetoprotein was within normal range and her group  B strep at 36 weeks was negative.   PHYSICAL EXAMINATION:  VITAL SIGNS: Stable, afebrile.  HEENT: Grossly within normal limits.  HEART: Regular rhythm and rate.  CHEST: Clear.  BREASTS: Soft and nontender.  ABDOMEN: Gravid with uterine contractions every 2 minutes.  Fetal heart rate  is reassuring.  CERVIX: Cervix is 7-8 cm, 90%, vertex, -1 with clear fluid.  EXTREMITIES: Within normal limits.   ASSESSMENT:  1. Intrauterine pregnancy at term.  2. Spontaneous rupture of the membranes with clear fluid.  3. Transition stage of labor.  4. Negative group B streptococci.   PLAN:  Admit to labor and delivery, to follow routine CNM orders, to  anticipate a normal spontaneous vaginal delivery, and to notify Dr. Estanislado Pandy  of the patient's admission.     Concha Pyo. Duplantis, C.N.M.              Crist Fat Rivard,  M.D.    SJD/MEDQ  D:  01/10/2002  T:  01/10/2002  Job:  [id number]

## 2010-07-25 NOTE — Discharge Summary (Signed)
   NAME:  Lisa Orr, Lisa Orr NO.:  [id number]   MEDICAL RECORD NO.:  [id number]                   PATIENT TYPE:  INP   LOCATION:  9119                                 [facility]ILITY[facility]   PHYSICIAN:  Crist Fat. Rivard, M.D.              DATE OF BIRTH:  November 20, 1965   DATE OF ADMISSION:  01/10/2002  DATE OF DISCHARGE:  01/12/2002                                 DISCHARGE SUMMARY   ADMISSION DIAGNOSES:  1. Intrauterine pregnancy at term.  2. Spontaneous rupture of membranes.  3. Active labor, transition.  4. Group B Strep negative.   DISCHARGE DIAGNOSES:  1. Intrauterine pregnancy at term.  2. Spontaneous rupture of membranes.  3. Active labor, transition.  4. Group B Strep negative.  5. Infant in NICU secondary to pneumonitis.   HOSPITAL PROCEDURES:  Spontaneous vaginal delivery.   HOSPITAL COURSE:  The patient was admitted in the transition phase of active  labor and progressed quickly to spontaneous vaginal delivery of a viable  female infant named [name].  Apgars 9 and 9 weighing 7 pounds over an intact  perineum.  Infant was vigorous and cried spontaneously at delivery.  The  first day of recovery was uneventful except that the baby developed  pneumonitis and was taken to NICU for respiratory care and support.  The  baby is improving on postpartum day number two and beginning to take feeds.  Mother's progress has been good with stable vital signs.  She has remained  afebrile.  Heart rate was regular rate and rhythm.  Lungs clear.  Abdomen  soft, nontender.  Her bowel sounds are positive but she has not had a bowel  movement yet.  Fundus is firm.  Lochia is small to moderate.  Perineum is  healing of a first degree laceration and her extremities are within normal  limits.  On postpartum day number two she was deemed to have received the  full benefit of her hospital stay and was discharged home.   DISCHARGE MEDICATIONS:  1. Motrin 600 mg p.o. q.6h.  p.r.n.  2. Micronor 1  p.o. q.d.   DISCHARGE LABORATORIES:  White blood cell count 10.8, hemoglobin 11.5,  hematocrit 33.5, platelets 186,000.  RPR nonreactive.   DISCHARGE INSTRUCTIONS:  Per [facility] handout.   DISCHARGE FOLLOWUP:  Six weeks or p.r.n.    Marie L. Williams, C.N.M.                 Crist Fat Rivard, M.D.   MLW/MEDQ  D:  01/12/2002  T:  01/12/2002  Job:  [id number]

## 2010-08-05 NOTE — Telephone Encounter (Signed)
Referred to Dellia Beckwith, Office Manager/Director.

## 2010-09-18 NOTE — Telephone Encounter (Signed)
Patient has no balance with [place] according to chart check by Clarnce Flock.

## 2011-04-02 ENCOUNTER — Other Ambulatory Visit: Payer: Self-pay | Admitting: Obstetrics and Gynecology

## 2011-04-07 ENCOUNTER — Other Ambulatory Visit: Payer: Self-pay | Admitting: Obstetrics and Gynecology

## 2011-04-07 DIAGNOSIS — Z1231 Encounter for screening mammogram for malignant neoplasm of breast: Secondary | ICD-10-CM

## 2011-04-13 ENCOUNTER — Ambulatory Visit
Admission: RE | Admit: 2011-04-13 | Discharge: 2011-04-13 | Disposition: A | Discharge status: Home / Self Care | Payer: Self-pay | Source: Ambulatory Visit | Attending: Obstetrics and Gynecology | Admitting: Obstetrics and Gynecology

## 2011-04-13 DIAGNOSIS — Z1231 Encounter for screening mammogram for malignant neoplasm of breast: Secondary | ICD-10-CM

## 2011-11-05 ENCOUNTER — Ambulatory Visit (INDEPENDENT_AMBULATORY_CARE_PROVIDER_SITE_OTHER): Payer: Self-pay | Admitting: Obstetrics and Gynecology

## 2011-11-05 ENCOUNTER — Telehealth: Payer: Self-pay | Admitting: Obstetrics and Gynecology

## 2011-11-05 ENCOUNTER — Encounter: Payer: Self-pay | Admitting: Obstetrics and Gynecology

## 2011-11-05 VITALS — BP 120/70 | HR 68 | Resp 16 | Ht 63.0 in | Wt 127.0 lb

## 2011-11-05 DIAGNOSIS — N898 Other specified noninflammatory disorders of vagina: Secondary | ICD-10-CM

## 2011-11-05 DIAGNOSIS — N899 Noninflammatory disorder of vagina, unspecified: Secondary | ICD-10-CM

## 2011-11-05 DIAGNOSIS — Z124 Encounter for screening for malignant neoplasm of cervix: Secondary | ICD-10-CM

## 2011-11-05 LAB — POCT WET PREP (WET MOUNT)
Bacteria Wet Prep HPF POC: NEGATIVE
Clue Cells Wet Prep Whiff POC: NEGATIVE
WBC, Wet Prep HPF POC: NEGATIVE
pH: 5

## 2011-11-05 MED ORDER — NORGESTIM-ETH ESTRAD TRIPHASIC 0.18/0.215/0.25 MG-25 MCG PO TABS: 1.0000 | ORAL_TABLET | Freq: Every day | ORAL | Status: AC

## 2011-11-05 MED ORDER — TINIDAZOLE 500 MG PO TABS: 2.0000 g | ORAL_TABLET | Freq: Every day | ORAL | Status: AC

## 2011-11-05 MED ORDER — CLOTRIMAZOLE-BETAMETHASONE 1-0.05 % EX CREA: TOPICAL_CREAM | Freq: Every day | CUTANEOUS | Status: AC

## 2011-11-05 NOTE — Telephone Encounter (Signed)
Triage/appt req. °

## 2011-11-05 NOTE — Telephone Encounter (Signed)
Pt called requesting appt today for vaginal itching.  Pt worked in today for appt w/ AR for eval.

## 2011-11-05 NOTE — Progress Notes (Signed)
Contraception BC PILL Last pap 01/15/2011 WNL Last Mammo 04/13/2011 WNL Last Colonoscopy 04/2010 WNL Last Dexa Scan None Primary MD Charlynn Court Abuse at Home None  C/o labial irritation x several days but denies an abnl d/c.  After exam completed pt reports occas spotting midcycle.  Filed Vitals:   11/05/11 1523  BP: 120/70  Pulse: 68  Resp: 16   ROS: noncontributory  Physical Examination: General appearance - alert, well appearing, and in no distress Neck - supple, no significant adenopathy Chest - clear to auscultation, no wheezes, rales or rhonchi, symmetric air entry Heart - normal rate and regular rhythm Abdomen - soft, nontender, nondistended, no masses or organomegaly Breasts - breasts appear normal, no suspicious masses, no skin or nipple changes or axillary nodes Pelvic - normal external genitalia, vulva, vagina, cervix, uterus and adnexa, no abnl d/c, ?nabothian cyst on post lip of cervix Back exam - no CVAT Extremities - no edema, redness or tenderness in the calves or thighs Rectal no masses  Results for orders placed in visit on 11/05/11  POCT WET PREP (WET MOUNT)      Component Value Range   Source Wet Prep POC       WBC, Wet Prep HPF POC neg     Bacteria Wet Prep HPF POC neg     BACTERIA WET PREP MORPHOLOGY POC       Clue Cells Wet Prep HPF POC Few     CLUE CELLS WET PREP WHIFF POC Negative Whiff     Yeast Wet Prep HPF POC None     KOH Wet Prep POC       Trichomonas Wet Prep HPF POC neg     pH 5.0      A/P Wet prep - BV - Tindamax Pap today - If pap abnl will bx the post lip of cervix Vulvitis - Lotrisone OCP refill

## 2011-11-06 LAB — PAP IG W/ RFLX HPV ASCU

## 2011-11-10 ENCOUNTER — Encounter: Payer: Self-pay | Admitting: Obstetrics and Gynecology

## 2012-03-24 ENCOUNTER — Other Ambulatory Visit: Payer: Self-pay | Admitting: Obstetrics and Gynecology

## 2012-03-24 DIAGNOSIS — Z1231 Encounter for screening mammogram for malignant neoplasm of breast: Secondary | ICD-10-CM

## 2012-04-08 ENCOUNTER — Other Ambulatory Visit: Payer: Self-pay | Admitting: Obstetrics and Gynecology

## 2012-04-11 ENCOUNTER — Telehealth: Payer: Self-pay | Admitting: Obstetrics and Gynecology

## 2012-04-11 MED ORDER — NORGESTIM-ETH ESTRAD TRIPHASIC 0.18/0.215/0.25 MG-35 MCG PO TABS: 1.0000 | ORAL_TABLET | Freq: Every day | ORAL | Status: AC

## 2012-04-11 NOTE — Telephone Encounter (Signed)
Rx Tri-Sprintec sent to pharmacy  Darien Ramus, CMA

## 2012-04-22 ENCOUNTER — Ambulatory Visit: Payer: Self-pay

## 2012-04-28 ENCOUNTER — Ambulatory Visit: Payer: Self-pay

## 2012-08-23 ENCOUNTER — Other Ambulatory Visit: Payer: Self-pay

## 2012-08-23 DIAGNOSIS — Z1231 Encounter for screening mammogram for malignant neoplasm of breast: Secondary | ICD-10-CM

## 2012-08-29 ENCOUNTER — Ambulatory Visit
Admission: RE | Admit: 2012-08-29 | Discharge: 2012-08-29 | Disposition: A | Discharge status: Home / Self Care | Payer: BC Managed Care – PPO | Source: Ambulatory Visit

## 2012-08-29 ENCOUNTER — Ambulatory Visit: Payer: Self-pay

## 2012-08-29 DIAGNOSIS — Z1231 Encounter for screening mammogram for malignant neoplasm of breast: Secondary | ICD-10-CM

## 2013-05-29 ENCOUNTER — Encounter (HOSPITAL_COMMUNITY): Payer: Self-pay | Admitting: Emergency Medicine

## 2013-05-29 ENCOUNTER — Emergency Department (INDEPENDENT_AMBULATORY_CARE_PROVIDER_SITE_OTHER)
Admission: EM | Admit: 2013-05-29 | Discharge: 2013-05-29 | Disposition: A | Discharge status: Home / Self Care | Payer: BC Managed Care – PPO | Source: Home / Self Care | Attending: Family Medicine | Admitting: Family Medicine

## 2013-05-29 DIAGNOSIS — J309 Allergic rhinitis, unspecified: Secondary | ICD-10-CM

## 2013-05-29 MED ORDER — IPRATROPIUM BROMIDE 0.06 % NA SOLN: 2.0000 | Freq: Four times a day (QID) | NASAL | Status: AC

## 2013-05-29 MED ORDER — LORATADINE 10 MG PO TABS: 10.0000 mg | ORAL_TABLET | Freq: Every day | ORAL | Status: AC

## 2013-05-29 NOTE — Discharge Instructions (Signed)
Allergic Rhinitis Allergic rhinitis is when the mucous membranes in the nose respond to allergens. Allergens are particles in the air that cause your body to have an allergic reaction. This causes you to release allergic antibodies. Through a chain of events, these eventually cause you to release histamine into the blood stream. Although meant to protect the body, it is this release of histamine that causes your discomfort, such as frequent sneezing, congestion, and an itchy, runny nose.  CAUSES  Seasonal allergic rhinitis (hay fever) is caused by pollen allergens that may come from grasses, trees, and weeds. Year-round allergic rhinitis (perennial allergic rhinitis) is caused by allergens such as house dust mites, pet dander, and mold spores.  SYMPTOMS   Nasal stuffiness (congestion).  Itchy, runny nose with sneezing and tearing of the eyes. DIAGNOSIS  Your health care provider can help you determine the allergen or allergens that trigger your symptoms. If you and your health care provider are unable to determine the allergen, skin or blood testing may be used. TREATMENT  Allergic Rhinitis does not have a cure, but it can be controlled by:  Medicines and allergy shots (immunotherapy).  Avoiding the allergen. Hay fever may often be treated with antihistamines in pill or nasal spray forms. Antihistamines block the effects of histamine. There are over-the-counter medicines that may help with nasal congestion and swelling around the eyes. Check with your health care provider before taking or giving this medicine.  If avoiding the allergen or the medicine prescribed do not work, there are many new medicines your health care provider can prescribe. Stronger medicine may be used if initial measures are ineffective. Desensitizing injections can be used if medicine and avoidance does not work. Desensitization is when a patient is given ongoing shots until the body becomes less sensitive to the allergen.  Make sure you follow up with your health care provider if problems continue. HOME CARE INSTRUCTIONS It is not possible to completely avoid allergens, but you can reduce your symptoms by taking steps to limit your exposure to them. It helps to know exactly what you are allergic to so that you can avoid your specific triggers. SEEK MEDICAL CARE IF:   You have a fever.  You develop a cough that does not stop easily (persistent).  You have shortness of breath.  You start wheezing.  Symptoms interfere with normal daily activities. Document Released: 11/18/2000 Document Revised: 12/14/2012 Document Reviewed: 10/31/2012 Exit[employer] Patient Information 2014 [employer].  

## 2013-05-29 NOTE — ED Provider Notes (Signed)
CSN: [id number]     Arrival date & time 05/29/13  0904 History   None    No chief complaint on file.  (Consider location/radiation/quality/duration/timing/severity/associated sxs/prior Treatment) HPI Comments: Presents with 6 day history of post nasal drainage, throat irritation,  ear congestion and cough after working in her yard for four days last week. Has tried a few OTC cough and cold medications with minimal relief. No fever or dyspnea. Patient is non-smoker. Feels that cough is precipitated by post nasal drainage and "mucous."  Daughter recently ill with URI.   The history is provided by the patient.    Past Medical History  Diagnosis Date  . RUQ pain   . GERD (gastroesophageal reflux disease)   . Nausea   . Headache   . Microscopic hematuria   . Unspecified disorder of menstruation and other abnormal bleeding from female genital tract   . 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 Abdominal  pain, other specified site    Past Surgical History  Procedure Laterality Date  . Tonsillectomy     Family History  Problem Relation Age of Onset  . Colitis Mother   . Irritable bowel syndrome Mother   . Colon polyps Maternal Aunt   . Colon polyps Maternal Grandfather    History  Substance Use Topics  . Smoking status: Never Smoker   . Smokeless tobacco: Never Used  . Alcohol Use: No   OB History   Grav Para Term Preterm Abortions TAB SAB Ect Mult Living                 Review of Systems  All other systems reviewed and are negative.    Allergies  Review of patient's allergies indicates no known allergies.  Home Medications   Current Outpatient Rx  Name  Route  Sig  Dispense  Refill  . Calcium-Magnesium (CAL/MAG CITRATE) 250-125 MG TABS   Oral   Take 6 tablets by mouth daily.           . clidinium-chlordiazePOXIDE (LIBRAX) 2.5-5 MG per capsule   Oral   Take 1 capsule by mouth 3 (three) times daily before meals.           Marland Kitchen. ipratropium  (ATROVENT) 0.06 % nasal spray   Each Nare   Place 2 sprays into both nostrils 4 (four) times daily.   15 mL   0   . loratadine (CLARITIN) 10 MG tablet   Oral   Take 1 tablet (10 mg total) by mouth daily.   30 tablet   0   . Norgestimate-Ethinyl Estradiol Triphasic (ORTHO TRI-CYCLEN LO) 0.18/0.215/0.25 MG-25 MCG tab   Oral   Take 1 tablet by mouth daily.   1 Package   11   . Norgestimate-Ethinyl Estradiol Triphasic (TRI-SPRINTEC) 0.18/0.215/0.25 MG-35 MCG tablet   Oral   Take 1 tablet by mouth daily.   1 Package   7   . omeprazole (PRILOSEC) 40 MG capsule   Oral   Take 40 mg by mouth daily.            BP 141/94  Pulse 90  Temp(Src) 97.6 F (36.4 C) (Oral)  Resp 16  SpO2 99% Physical Exam  Nursing note and vitals reviewed. Constitutional: She is oriented to person, place, and time. She appears well-developed and well-nourished. No distress.  HENT:  Head: Normocephalic and atraumatic.  Right Ear: Hearing, tympanic membrane, external ear and ear canal normal.  Left Ear: Hearing, tympanic  membrane, external ear and ear canal normal.  Nose: Nose normal.  Mouth/Throat: Uvula is midline, oropharynx is clear and moist and mucous membranes are normal.  Eyes: Conjunctivae are normal. Right eye exhibits no discharge. Left eye exhibits no discharge. No scleral icterus.  Neck: Normal range of motion. Neck supple. No thyromegaly present.  Cardiovascular: Normal rate, regular rhythm and normal heart sounds.   Pulmonary/Chest: Effort normal and breath sounds normal. No respiratory distress. She has no wheezes.  Abdominal: There is no tenderness.  Musculoskeletal: Normal range of motion.  Lymphadenopathy:    She has no cervical adenopathy.  Neurological: She is alert and oriented to person, place, and time.  Skin: Skin is warm and dry.  Psychiatric: She has a normal mood and affect. Her behavior is normal.    ED Course  Procedures (including critical care time) Labs  Review Labs Reviewed - No data to display Imaging Review No results found.   MDM   1. Allergic rhinitis   Claritin and Atrovent nasal spray with PCP follow up if no improvement.     Jess Barters [place], [place] 05/29/13 [phone]

## 2013-05-29 NOTE — ED Notes (Signed)
Pt  Reports  Symptoms  Of  Congested  Stuffy  Nose               sorethroat  At  Night    Symptoms  X  6  Days

## 2013-05-31 NOTE — ED Provider Notes (Signed)
Medical screening examination/treatment/procedure(s) were performed by a resident physician or non-physician practitioner and as the supervising physician I was immediately available for consultation/collaboration.  Sam Overbeck, MD    Chera Slivka S Agata Lucente, MD 05/31/13 0747 

## 2013-05-31 NOTE — ED Notes (Signed)
Patient called w many questions about her care from her Monday visit

## 2013-05-31 NOTE — ED Notes (Signed)
Patient called, left message on answering machine on Tuesday AM, c/o she was feeling worse , and was starting to have green secretions. Called and spoke to patient today about her call yesterday , and pt c/o she has lost her voice, and is having green secretions. Spoke w Marina GravelL Presson, PA , who examined pt on Monday, and as the pt is having new symptoms, and is feeling worse, she would need a new exam , either here or with her regular MD. Pt objected to this, as she c/o she would have another co-pay. It was explained to pt, as per instructions of provider from Monday visit ,but pt still dissatisfied. Stated she is going on a cruise next week, and she needed to get rid of this. Instructions from provider repeated, but still unhappy. Was advised providers will not treat what they cannot see, and we cannot see what is happening over the phone, we must be able to visually inspect patients to be able to treat correctly , and cannot treat what we cannot see at the time of exam. Things can change over night, and if a change or worsening of health occurs then they have to be rechecked . Pt still unhappy

## 2013-05-31 NOTE — ED Notes (Signed)
Pt called and left message on answering machine Tuesday AM that she is feeling worse to c/o she is feeling worse

## 2013-07-26 ENCOUNTER — Other Ambulatory Visit: Payer: Self-pay

## 2013-07-26 DIAGNOSIS — Z1231 Encounter for screening mammogram for malignant neoplasm of breast: Secondary | ICD-10-CM

## 2013-08-30 ENCOUNTER — Ambulatory Visit
Admission: RE | Admit: 2013-08-30 | Discharge: 2013-08-30 | Disposition: A | Discharge status: Home / Self Care | Payer: BC Managed Care – PPO | Source: Ambulatory Visit

## 2013-08-30 ENCOUNTER — Encounter (INDEPENDENT_AMBULATORY_CARE_PROVIDER_SITE_OTHER): Payer: Self-pay

## 2013-08-30 DIAGNOSIS — Z1231 Encounter for screening mammogram for malignant neoplasm of breast: Secondary | ICD-10-CM

## 2013-12-04 ENCOUNTER — Encounter: Payer: Self-pay | Admitting: Gastroenterology

## 2014-08-16 ENCOUNTER — Other Ambulatory Visit: Payer: Self-pay

## 2014-08-16 DIAGNOSIS — Z1231 Encounter for screening mammogram for malignant neoplasm of breast: Secondary | ICD-10-CM

## 2014-09-03 ENCOUNTER — Ambulatory Visit
Admission: RE | Admit: 2014-09-03 | Discharge: 2014-09-03 | Disposition: A | Discharge status: Home / Self Care | Payer: BLUE CROSS/BLUE SHIELD | Source: Ambulatory Visit

## 2014-09-03 DIAGNOSIS — Z1231 Encounter for screening mammogram for malignant neoplasm of breast: Secondary | ICD-10-CM

## 2015-07-30 ENCOUNTER — Other Ambulatory Visit: Payer: Self-pay

## 2015-07-30 DIAGNOSIS — Z1231 Encounter for screening mammogram for malignant neoplasm of breast: Secondary | ICD-10-CM

## 2015-09-04 ENCOUNTER — Ambulatory Visit: Payer: BLUE CROSS/BLUE SHIELD

## 2015-09-05 ENCOUNTER — Emergency Department (HOSPITAL_COMMUNITY): Payer: BLUE CROSS/BLUE SHIELD

## 2015-09-05 ENCOUNTER — Emergency Department (HOSPITAL_COMMUNITY)
Admission: EM | Admit: 2015-09-05 | Discharge: 2015-09-05 | Disposition: A | Discharge status: Home / Self Care | Payer: BLUE CROSS/BLUE SHIELD | Attending: Emergency Medicine | Admitting: Emergency Medicine

## 2015-09-05 ENCOUNTER — Encounter (HOSPITAL_COMMUNITY): Payer: Self-pay | Admitting: Emergency Medicine

## 2015-09-05 DIAGNOSIS — Y92410 Unspecified street and highway as the place of occurrence of the external cause: Secondary | ICD-10-CM | POA: Diagnosis not present

## 2015-09-05 DIAGNOSIS — S8992XA Unspecified injury of left lower leg, initial encounter: Secondary | ICD-10-CM | POA: Diagnosis present

## 2015-09-05 DIAGNOSIS — T07XXXA Unspecified multiple injuries, initial encounter: Secondary | ICD-10-CM

## 2015-09-05 DIAGNOSIS — Y999 Unspecified external cause status: Secondary | ICD-10-CM | POA: Diagnosis not present

## 2015-09-05 DIAGNOSIS — S62603A Fracture of unspecified phalanx of left middle finger, initial encounter for closed fracture: Secondary | ICD-10-CM | POA: Diagnosis not present

## 2015-09-05 DIAGNOSIS — S63502A Unspecified sprain of left wrist, initial encounter: Secondary | ICD-10-CM | POA: Diagnosis not present

## 2015-09-05 DIAGNOSIS — S63509A Unspecified sprain of unspecified wrist, initial encounter: Secondary | ICD-10-CM

## 2015-09-05 DIAGNOSIS — S8391XA Sprain of unspecified site of right knee, initial encounter: Secondary | ICD-10-CM | POA: Insufficient documentation

## 2015-09-05 DIAGNOSIS — S8000XA Contusion of unspecified knee, initial encounter: Secondary | ICD-10-CM

## 2015-09-05 DIAGNOSIS — Y9389 Activity, other specified: Secondary | ICD-10-CM | POA: Diagnosis not present

## 2015-09-05 MED ORDER — ACETAMINOPHEN 325 MG PO TABS
650.0000 mg | ORAL_TABLET | Freq: Once | ORAL | Status: AC
  Administered 2015-09-05: 650 mg via ORAL
  Filled 2015-09-05: qty 2

## 2015-09-05 MED ORDER — TETANUS-DIPHTH-ACELL PERTUSSIS 5-2.5-18.5 LF-MCG/0.5 IM SUSP
0.5000 mL | Freq: Once | INTRAMUSCULAR | Status: AC
  Administered 2015-09-05: 0.5 mL via INTRAMUSCULAR
  Filled 2015-09-05: qty 0.5

## 2015-09-05 MED ORDER — HYDROCODONE-ACETAMINOPHEN 5-325 MG PO TABS: 1.0000 | ORAL_TABLET | Freq: Four times a day (QID) | ORAL | Status: AC | PRN

## 2015-09-05 MED ORDER — NAPROXEN 500 MG PO TABS: 500.0000 mg | ORAL_TABLET | Freq: Two times a day (BID) | ORAL | Status: AC | PRN

## 2015-09-05 MED ORDER — CYCLOBENZAPRINE HCL 10 MG PO TABS: 10.0000 mg | ORAL_TABLET | Freq: Three times a day (TID) | ORAL | Status: AC | PRN
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
## 2015-09-05 NOTE — ED Notes (Signed)
PT DISCHARGED. INSTRUCTIONS AND PRESCRIPTIONS GIVEN. AAOX4. PT IN NO APPARENT DISTRESS OR PAIN. THE OPPORTUNITY TO ASK QUESTIONS WAS PROVIDED. 
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
## 2015-09-11 ENCOUNTER — Ambulatory Visit: Payer: BLUE CROSS/BLUE SHIELD

## 2015-09-20 ENCOUNTER — Ambulatory Visit: Payer: BLUE CROSS/BLUE SHIELD

## 2015-09-30 ENCOUNTER — Ambulatory Visit: Payer: BLUE CROSS/BLUE SHIELD

## 2015-10-04 ENCOUNTER — Ambulatory Visit
Admission: RE | Admit: 2015-10-04 | Discharge: 2015-10-04 | Disposition: A | Discharge status: Home / Self Care | Payer: BLUE CROSS/BLUE SHIELD | Source: Ambulatory Visit

## 2015-10-04 DIAGNOSIS — Z1231 Encounter for screening mammogram for malignant neoplasm of breast: Secondary | ICD-10-CM

## 2016-06-18 ENCOUNTER — Encounter (HOSPITAL_COMMUNITY): Payer: Self-pay | Admitting: Emergency Medicine

## 2016-06-18 ENCOUNTER — Ambulatory Visit (HOSPITAL_COMMUNITY)
Admission: EM | Admit: 2016-06-18 | Discharge: 2016-06-18 | Disposition: A | Discharge status: Home / Self Care | Payer: Commercial Managed Care - HMO | Attending: Family Medicine | Admitting: Family Medicine

## 2016-06-18 DIAGNOSIS — L237 Allergic contact dermatitis due to plants, except food: Secondary | ICD-10-CM

## 2016-06-18 MED ORDER — METHYLPREDNISOLONE ACETATE 80 MG/ML IJ SUSP
INTRAMUSCULAR | Status: AC
  Filled 2016-06-18: qty 1

## 2016-06-18 MED ORDER — PREDNISONE 10 MG (21) PO TBPK: ORAL_TABLET | ORAL | 0 refills | Status: AC

## 2016-06-18 MED ORDER — METHYLPREDNISOLONE ACETATE 80 MG/ML IJ SUSP
80.0000 mg | Freq: Once | INTRAMUSCULAR | Status: AC
Start: 2016-06-18 — End: 2016-06-18
  Administered 2016-06-18: 80 mg via INTRAMUSCULAR

## 2016-06-18 NOTE — Discharge Instructions (Signed)
Your wound has been bandaged here in the clinic, and you've received an injection of Depo-Medrol. I prescribed a prednisone taper, starting in the morning take 6 tablets and then decreased by one daily until finished. I also recommend taking over-the-counter Benadryl 2 tablets every 6 hours, not to exceed 300 mg and any 24-hour period. You can continue this for 3-4 days as needed. Should your symptoms persist, or fail to resolve, follow up with her primary care provider or return to clinic

## 2016-06-18 NOTE — ED Provider Notes (Signed)
CSN: [id number]     Arrival date & time 06/18/16  1918 History   First MD Initiated Contact with Patient 06/18/16 1955     Chief Complaint  Patient presents with  . Rash   (Consider location/radiation/quality/duration/timing/severity/associated sxs/prior Treatment) 51 year old female presents to clinic for evaluation of a rash on her forearm, right chest, right abdomen. States she was cleaning brush around her house, blue she got into some poison [place]. She has a blistering, vesicular, rash and forearm, and her chest, and is itching, however it is not painful.   The history is provided by the patient.  Rash  Location:  Shoulder/arm and torso Shoulder/arm rash location:  R forearm Torso rash location:  R chest and R breast Quality: blistering, draining, itchiness, redness and weeping   Quality: not burning and not painful   Severity:  Moderate Onset quality:  Gradual Duration:  4 days Timing:  Constant Progression:  Worsening Chronicity:  New Context: plant contact   Relieved by:  Antihistamines Worsened by:  Contact Associated symptoms: no fatigue, no fever, no headaches, no myalgias, no nausea, no shortness of breath, no sore throat, no tongue swelling, not vomiting and not wheezing     Past Medical History:  Diagnosis Date  . Abdominal pain, other specified site   . GERD (gastroesophageal reflux disease)   . Headache(784.0)   . Microscopic hematuria   . Nausea   . 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 RUQ pain   . Unspecified disorder of menstruation and other abnormal bleeding from female genital tract    Past Surgical History:  Procedure Laterality Date  . TONSILLECTOMY     Family History  Problem Relation Age of Onset  . Colitis Mother   . Irritable bowel syndrome Mother   . Colon polyps Maternal Aunt   . Colon polyps Maternal Grandfather    Social History  Substance Use Topics  . Smoking status: Never Smoker  . Smokeless tobacco: Never  Used  . Alcohol use No   OB History    No data available     Review of Systems  Constitutional: Negative for fatigue and fever.  HENT: Negative for congestion, sneezing and sore throat.   Eyes: Negative for pain and itching.  Respiratory: Negative for shortness of breath and wheezing.   Gastrointestinal: Negative for nausea and vomiting.  Genitourinary: Negative.   Musculoskeletal: Negative for myalgias and neck pain.  Skin: Positive for rash.  Neurological: Negative for light-headedness and headaches.    Allergies  Wasp venom  Home Medications   Prior to Admission medications   Medication Sig Start Date End Date Taking? Authorizing Provider  Calcium-Magnesium (CAL/MAG CITRATE) 250-125 MG TABS Take 6 tablets by mouth daily.      Historical Provider, MD  clidinium-chlordiazePOXIDE (LIBRAX) 2.5-5 MG per capsule Take 1 capsule by mouth 3 (three) times daily before meals.      Historical Provider, MD  cyclobenzaprine (FLEXERIL) 10 MG tablet Take 1 tablet (10 mg total) by mouth 3 (three) times daily as needed for muscle spasms. 09/05/15   Mercedes Street, PA-C  HYDROcodone-acetaminophen (NORCO) 5-325 MG tablet Take 1 tablet by mouth every 6 (six) hours as needed for severe pain. 09/05/15   Mercedes Street, PA-C  ipratropium (ATROVENT) 0.06 % nasal spray Place 2 sprays into both nostrils 4 (four) times daily. 05/29/13   Mathis Fare Presson, PA  loratadine (CLARITIN) 10 MG tablet Take 1 tablet (10 mg total) by mouth daily.  05/29/13   Mathis Fare Presson, PA  naproxen (NAPROSYN) 500 MG tablet Take 1 tablet (500 mg total) by mouth 2 (two) times daily as needed for mild pain, moderate pain or headache (TAKE WITH MEALS.). 09/05/15   Mercedes Street, PA-C  Norgestimate-Ethinyl Estradiol Triphasic (ORTHO TRI-CYCLEN LO) 0.18/0.215/0.25 MG-25 MCG tab Take 1 tablet by mouth daily. 11/05/11   Osborn Coho, MD  Norgestimate-Ethinyl Estradiol Triphasic (TRI-SPRINTEC) 0.18/0.215/0.25 MG-35 MCG tablet  Take 1 tablet by mouth daily. 04/11/12   Kirkland Hun, MD  omeprazole (PRILOSEC) 40 MG capsule Take 40 mg by mouth daily.      Historical Provider, MD   Meds Ordered and Administered this Visit   Medications  methylPREDNISolone acetate (DEPO-MEDROL) injection 80 mg (80 mg Intramuscular Given 06/18/16 2017)    BP 137/80   Pulse 85   Temp 98.7 F (37.1 C) (Oral)   Resp 18   SpO2 100%  No data found.   Physical Exam  Constitutional: She is oriented to person, place, and time. She appears well-developed and well-nourished. No distress.  HENT:  Head: Normocephalic and atraumatic.  Right Ear: External ear normal.  Left Ear: External ear normal.  Eyes: Conjunctivae are normal. Right eye exhibits no discharge. Left eye exhibits no discharge.  Cardiovascular: Normal rate and regular rhythm.   Pulmonary/Chest: Effort normal and breath sounds normal.  Neurological: She is alert and oriented to person, place, and time.  Skin: Skin is warm and dry. Capillary refill takes less than 2 seconds. Rash noted. Rash is pustular and urticarial. She is not diaphoretic.     Psychiatric: She has a normal mood and affect. Her behavior is normal.  Nursing note and vitals reviewed.   Urgent Care Course     Procedures (including critical care time)  Labs Review Labs Reviewed - No data to display  Imaging Review No results found.      MDM   1. Allergic dermatitis due to poison oak    Treating for contact dermatitis due to exposure to poison [place], given injection of Depo-Medrol in clinic, discharged home on prednisone taper, provided counseling on over-the-counter Benadryl use for symptom management. If symptoms persist, return to clinic or follow-up with primary care provider.     Dorena Bodo, NP 06/18/16 2029

## 2016-06-18 NOTE — ED Triage Notes (Signed)
Patient reports noticing rash either Sunday evening .  Patient had been working in the yard on Friday.  Rash spreading and itching.  Blisters present

## 2016-08-26 ENCOUNTER — Other Ambulatory Visit: Payer: Self-pay | Admitting: Obstetrics and Gynecology

## 2016-08-26 DIAGNOSIS — Z1231 Encounter for screening mammogram for malignant neoplasm of breast: Secondary | ICD-10-CM

## 2016-10-05 ENCOUNTER — Ambulatory Visit
Admission: RE | Admit: 2016-10-05 | Discharge: 2016-10-05 | Disposition: A | Discharge status: Home / Self Care | Payer: 59 | Source: Ambulatory Visit | Attending: Obstetrics and Gynecology | Admitting: Obstetrics and Gynecology

## 2016-10-05 DIAGNOSIS — Z1231 Encounter for screening mammogram for malignant neoplasm of breast: Secondary | ICD-10-CM

## 2017-08-27 ENCOUNTER — Other Ambulatory Visit: Payer: Self-pay | Admitting: Obstetrics and Gynecology

## 2017-08-27 DIAGNOSIS — Z1231 Encounter for screening mammogram for malignant neoplasm of breast: Secondary | ICD-10-CM

## 2017-10-06 ENCOUNTER — Ambulatory Visit
Admission: RE | Admit: 2017-10-06 | Discharge: 2017-10-06 | Disposition: A | Discharge status: Home / Self Care | Payer: 59 | Source: Ambulatory Visit | Attending: Obstetrics and Gynecology | Admitting: Obstetrics and Gynecology

## 2017-10-06 DIAGNOSIS — Z1231 Encounter for screening mammogram for malignant neoplasm of breast: Secondary | ICD-10-CM
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
## 2022-02-24 LAB — COLOGUARD: COLOGUARD: NEGATIVE

## 2024-01-13 ENCOUNTER — Other Ambulatory Visit: Payer: Self-pay | Admitting: Obstetrics and Gynecology

## 2024-01-13 DIAGNOSIS — Z1231 Encounter for screening mammogram for malignant neoplasm of breast: Secondary | ICD-10-CM

## 2024-02-09 ENCOUNTER — Ambulatory Visit
Admission: RE | Admit: 2024-02-09 | Discharge: 2024-02-09 | Disposition: A | Source: Ambulatory Visit | Attending: Obstetrics and Gynecology | Admitting: Obstetrics and Gynecology

## 2024-02-09 DIAGNOSIS — Z1231 Encounter for screening mammogram for malignant neoplasm of breast: Secondary | ICD-10-CM
